# Patient Record
Sex: Male | Born: 1980 | Race: Black or African American | Hispanic: No | Marital: Single | Smoking: Current every day smoker
Health system: Southern US, Community
[De-identification: ages and names within clinical notes are randomized; demographics above are authoritative.]

---

## 2003-05-19 ENCOUNTER — Emergency Department (HOSPITAL_COMMUNITY): Admission: EM | Admit: 2003-05-19 | Discharge: 2003-05-19 | Payer: Self-pay | Admitting: Emergency Medicine

## 2004-02-11 ENCOUNTER — Emergency Department (HOSPITAL_COMMUNITY): Admission: EM | Admit: 2004-02-11 | Discharge: 2004-02-11 | Payer: Self-pay | Admitting: Emergency Medicine

## 2005-11-10 ENCOUNTER — Emergency Department (HOSPITAL_COMMUNITY): Admission: AC | Admit: 2005-11-10 | Discharge: 2005-11-10 | Payer: Self-pay

## 2011-08-09 ENCOUNTER — Encounter (HOSPITAL_COMMUNITY): Payer: Self-pay

## 2011-08-09 ENCOUNTER — Emergency Department (HOSPITAL_COMMUNITY)
Admission: EM | Admit: 2011-08-09 | Discharge: 2011-08-09 | Disposition: A | Discharge status: Home / Self Care | Payer: Self-pay | Attending: Emergency Medicine | Admitting: Emergency Medicine

## 2011-08-09 DIAGNOSIS — X58XXXA Exposure to other specified factors, initial encounter: Secondary | ICD-10-CM | POA: Insufficient documentation

## 2011-08-09 DIAGNOSIS — S335XXA Sprain of ligaments of lumbar spine, initial encounter: Secondary | ICD-10-CM | POA: Insufficient documentation

## 2011-08-09 DIAGNOSIS — S39012A Strain of muscle, fascia and tendon of lower back, initial encounter: Secondary | ICD-10-CM

## 2011-08-09 MED ORDER — OXYCODONE-ACETAMINOPHEN 5-325 MG PO TABS: 1.0000 | ORAL_TABLET | ORAL | Status: AC | PRN

## 2011-08-09 MED ORDER — CYCLOBENZAPRINE HCL 10 MG PO TABS: 10.0000 mg | ORAL_TABLET | Freq: Three times a day (TID) | ORAL | Status: AC | PRN

## 2011-08-09 NOTE — Discharge Instructions (Signed)
Take Ibuprofen - either 600 mg four times a day, or 800 mg three times a day. Take it with meals.  Back Pain, Adult Low back pain is very common. About 1 in 5 people have back pain.The cause of low back pain is rarely dangerous. The pain often gets better over time.About half of people with a sudden onset of back pain feel better in just 2 weeks. About 8 in 10 people feel better by 6 weeks.  CAUSES Some common causes of back pain include:  Strain of the muscles or ligaments supporting the spine.   Wear and tear (degeneration) of the spinal discs.   Arthritis.   Direct injury to the back.  DIAGNOSIS Most of the time, the direct cause of low back pain is not known.However, back pain can be treated effectively even when the exact cause of the pain is unknown.Answering your caregiver's questions about your overall health and symptoms is one of the most accurate ways to make sure the cause of your pain is not dangerous. If your caregiver needs more information, he or she may order lab work or imaging tests (X-rays or MRIs).However, even if imaging tests show changes in your back, this usually does not require surgery. HOME CARE INSTRUCTIONS For many people, back pain returns.Since low back pain is rarely dangerous, it is often a condition that people can learn to [facility] their own.   Remain active. It is stressful on the back to sit or stand in one place. Do not sit, drive, or stand in one place for more than 30 minutes at a time. Take short walks on level surfaces as soon as pain allows.Try to increase the length of time you walk each day.   Do not stay in bed.Resting more than 1 or 2 days can delay your recovery.   Do not avoid exercise or work.Your body is made to move.It is not dangerous to be active, even though your back may hurt.Your back will likely heal faster if you return to being active before your pain is gone.   Pay attention to your body when you bend and lift. Many  people have less discomfortwhen lifting if they bend their knees, keep the load close to their bodies,and avoid twisting. Often, the most comfortable positions are those that put less stress on your recovering back.   Find a comfortable position to sleep. Use a firm mattress and lie on your side with your knees slightly bent. If you lie on your back, put a pillow under your knees.   Only take over-the-counter or prescription medicines as directed by your caregiver. Over-the-counter medicines to reduce pain and inflammation are often the most helpful.Your caregiver may prescribe muscle relaxant drugs.These medicines help dull your pain so you can more quickly return to your normal activities and healthy exercise.   Put ice on the injured area.   Put ice in a plastic bag.   Place a towel between your skin and the bag.   Leave the ice on for 15 to 20 minutes, 3 to 4 times a day for the first 2 to 3 days. After that, ice and heat may be alternated to reduce pain and spasms.   Ask your caregiver about trying back exercises and gentle massage. This may be of some benefit.   Avoid feeling anxious or stressed.Stress increases muscle tension and can worsen back pain.It is important to recognize when you are anxious or stressed and learn ways to manage it.Exercise is a great  option.  SEEK MEDICAL CARE IF:  You have pain that is not relieved with rest or medicine.   You have pain that does not improve in 1 week.   You have new symptoms.   You are generally not feeling well.  SEEK IMMEDIATE MEDICAL CARE IF:   You have pain that radiates from your back into your legs.   You develop new bowel or bladder control problems.   You have unusual weakness or numbness in your arms or legs.   You develop nausea or vomiting.   You develop abdominal pain.   You feel faint.  Document Released: 04/02/2005 Document Revised: 03/22/2011 Document Reviewed: 08/21/2010 [facility] Patient Information  2012 [place], [place].  Cyclobenzaprine tablets What is this medicine? CYCLOBENZAPRINE (sye kloe BEN za preen) is a muscle relaxer. It is used to treat muscle pain, spasms, and stiffness. This medicine may be used for other purposes; ask your health care provider or pharmacist if you have questions. What should I tell my health care provider before I take this medicine? They need to know if you have any of these conditions: -heart disease, irregular heartbeat, or previous heart attack -liver disease -thyroid problem -an unusual or allergic reaction to cyclobenzaprine, tricyclic antidepressants, lactose, other medicines, foods, dyes, or preservatives -pregnant or trying to get pregnant -breast-feeding How should I use this medicine? Take this medicine by mouth with a glass of water. Follow the directions on the prescription label. If this medicine upsets your stomach, take it with food or milk. Take your medicine at regular intervals. Do not take it more often than directed. Talk to your pediatrician regarding the use of this medicine in children. Special care may be needed. Overdosage: If you think you have taken too much of this medicine contact a poison control center or emergency room at once. NOTE: This medicine is only for you. Do not share this medicine with others. What if I miss a dose? If you miss a dose, take it as soon as you can. If it is almost time for your next dose, take only that dose. Do not take double or extra doses. What may interact with this medicine? Do not take this medicine with any of the following medications: -cisapride -droperidol -flecainide -grepafloxacin -halofantrine -levomethadyl -MAOIs like Carbex, Eldepryl, Marplan, Nardil, and Parnate -nilotinib -pimozide -probucol -sertindole This medicine may also interact with the following medications: -abarelix -alcohol -contrast dyes -dolasetron -guanethidine -medicines for cancer -medicines for  depression, anxiety, or psychotic disturbances -medicines to treat an irregular heartbeat -medicines used for sleep or numbness during surgery or procedure -methadone -octreotide -ondansetron -palonosetron -phenothiazines like chlorpromazine, mesoridazine, prochlorperazine, thioridazine -some medicines for infection like alfuzosin, chloroquine, clarithromycin, levofloxacin, mefloquine, pentamidine, troleandomycin -tramadol -vardenafil This list may not describe all possible interactions. Give your health care provider a list of all the medicines, herbs, non-prescription drugs, or dietary supplements you use. Also tell them if you smoke, drink alcohol, or use illegal drugs. Some items may interact with your medicine. What should I watch for while using this medicine? Check with your doctor or health care professional if your condition does not improve within 1 to 3 weeks. You may get drowsy or dizzy when you first start taking the medicine or change doses. Do not drive, use machinery, or do anything that may be dangerous until you know how the medicine affects you. Stand or sit up slowly. Your mouth may get dry. Drinking water, chewing sugarless gum, or sucking on hard candy may help. What  side effects may I notice from receiving this medicine? Side effects that you should report to your doctor or health care professional as soon as possible: -allergic reactions like skin rash, itching or hives, swelling of the face, lips, or tongue -chest pain -fast heartbeat -hallucinations -seizures -vomiting Side effects that usually do not require medical attention (report to your doctor or health care professional if they continue or are bothersome): -headache This list may not describe all possible side effects. Call your doctor for medical advice about side effects. You may report side effects to FDA at 1-800-FDA-1088. Where should I keep my medicine? Keep out of the reach of children. Store at  room temperature between 15 and 30 degrees C (59 and 86 degrees F). Keep container tightly closed. Throw away any unused medicine after the expiration date. NOTE: This sheet is a summary. It may not cover all possible information. If you have questions about this medicine, talk to your doctor, pharmacist, or health care provider.  2012, [employer] Standard. (07/14/2007 10:26:21 PM)  Acetaminophen; Oxycodone tablets What is this medicine? ACETAMINOPHEN; OXYCODONE (a set a MEE noe fen; ox i KOE done) is a pain reliever. It is used to treat mild to moderate pain. This medicine may be used for other purposes; ask your health care provider or pharmacist if you have questions. What should I tell my health care provider before I take this medicine? They need to know if you have any of these conditions: -brain tumor -Crohn's disease, inflammatory bowel disease, or ulcerative colitis -drink more than 3 alcohol containing drinks per day -drug abuse or addiction -head injury -heart or circulation problems -kidney disease or problems going to the bathroom -liver disease -lung disease, asthma, or breathing problems -an unusual or allergic reaction to acetaminophen, oxycodone, other opioid analgesics, other medicines, foods, dyes, or preservatives -pregnant or trying to get pregnant -breast-feeding How should I use this medicine? Take this medicine by mouth with a full glass of water. Follow the directions on the prescription label. Take your medicine at regular intervals. Do not take your medicine more often than directed. Talk to your pediatrician regarding the use of this medicine in children. Special care may be needed. Patients over 23 years old may have a stronger reaction and need a smaller dose. Overdosage: If you think you have taken too much of this medicine contact a poison control center or emergency room at once. NOTE: This medicine is only for you. Do not share this medicine with  others. What if I miss a dose? If you miss a dose, take it as soon as you can. If it is almost time for your next dose, take only that dose. Do not take double or extra doses. What may interact with this medicine? -alcohol or medicines that contain alcohol -antihistamines -barbiturates like amobarbital, butalbital, butabarbital, methohexital, pentobarbital, phenobarbital, thiopental, and secobarbital -benztropine -drugs for bladder problems like solifenacin, trospium, oxybutynin, tolterodine, hyoscyamine, and methscopolamine -drugs for breathing problems like ipratropium and tiotropium -drugs for certain stomach or intestine problems like propantheline, homatropine methylbromide, glycopyrrolate, atropine, belladonna, and dicyclomine -general anesthetics like etomidate, ketamine, nitrous oxide, propofol, desflurane, enflurane, halothane, isoflurane, and sevoflurane -medicines for depression, anxiety, or psychotic disturbances -medicines for pain like codeine, morphine, pentazocine, buprenorphine, butorphanol, nalbuphine, tramadol, and propoxyphene -medicines for sleep -muscle relaxants -naltrexone -phenothiazines like perphenazine, thioridazine, chlorpromazine, mesoridazine, fluphenazine, prochlorperazine, promazine, and trifluoperazine -scopolamine -trihexyphenidyl This list may not describe all possible interactions. Give your health care provider a list of all the  medicines, herbs, non-prescription drugs, or dietary supplements you use. Also tell them if you smoke, drink alcohol, or use illegal drugs. Some items may interact with your medicine. What should I watch for while using this medicine? Tell your doctor or health care professional if your pain does not go away, if it gets worse, or if you have new or a different type of pain. You may develop tolerance to the medicine. Tolerance means that you will need a higher dose of the medication for pain relief. Tolerance is normal and is  expected if you take this medicine for a long time. Do not suddenly stop taking your medicine because you may develop a severe reaction. Your body becomes used to the medicine. This does NOT mean you are addicted. Addiction is a behavior related to getting and using a drug for a nonmedical reason. If you have pain, you have a medical reason to take pain medicine. Your doctor will tell you how much medicine to take. If your doctor wants you to stop the medicine, the dose will be slowly lowered over time to avoid any side effects. You may get drowsy or dizzy. Do not drive, use machinery, or do anything that needs mental alertness until you know how this medicine affects you. Do not stand or sit up quickly, especially if you are an older patient. This reduces the risk of dizzy or fainting spells. Alcohol may interfere with the effect of this medicine. Avoid alcoholic drinks. The medicine will cause constipation. Try to have a bowel movement at least every 2 to 3 days. If you do not have a bowel movement for 3 days, call your doctor or health care professional. Do not take Tylenol (acetaminophen) or medicines that have acetaminophen with this medicine. Too much acetaminophen can be very dangerous. Many nonprescription medicines contain acetaminophen. Always read the labels carefully to avoid taking more acetaminophen. What side effects may I notice from receiving this medicine? Side effects that you should report to your doctor or health care professional as soon as possible: -allergic reactions like skin rash, itching or hives, swelling of the face, lips, or tongue -breathing difficulties, wheezing -confusion -light headedness or fainting spells -severe stomach pain -yellowing of the skin or the whites of the eyes Side effects that usually do not require medical attention (report to your doctor or health care professional if they continue or are  bothersome): -dizziness -drowsiness -nausea -vomiting This list may not describe all possible side effects. Call your doctor for medical advice about side effects. You may report side effects to FDA at 1-800-FDA-1088. Where should I keep my medicine? Keep out of the reach of children. This medicine can be abused. Keep your medicine in a safe place to protect it from theft. Do not share this medicine with anyone. Selling or giving away this medicine is dangerous and against the law. Store at room temperature between 20 and 25 degrees C (68 and 77 degrees F). Keep container tightly closed. Protect from light. Flush any unused medicines down the toilet. Do not use the medicine after the expiration date. NOTE: This sheet is a summary. It may not cover all possible information. If you have questions about this medicine, talk to your doctor, pharmacist, or health care provider.  2012, [employer]vier/[employer] Standard. (03/01/2008 10:01:21 AM)
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
## 2011-08-09 NOTE — ED Notes (Signed)
Pt c/o back pain that started after putting on clothes. Denies injury

## 2012-11-13 ENCOUNTER — Emergency Department (HOSPITAL_COMMUNITY)
Admission: EM | Admit: 2012-11-13 | Discharge: 2012-11-13 | Disposition: A | Discharge status: Home / Self Care | Payer: Self-pay | Attending: Emergency Medicine | Admitting: Emergency Medicine

## 2012-11-13 ENCOUNTER — Encounter (HOSPITAL_COMMUNITY): Payer: Self-pay

## 2012-11-13 DIAGNOSIS — S39012A Strain of muscle, fascia and tendon of lower back, initial encounter: Secondary | ICD-10-CM

## 2012-11-13 DIAGNOSIS — F17200 Nicotine dependence, unspecified, uncomplicated: Secondary | ICD-10-CM | POA: Insufficient documentation

## 2012-11-13 DIAGNOSIS — X503XXA Overexertion from repetitive movements, initial encounter: Secondary | ICD-10-CM | POA: Insufficient documentation

## 2012-11-13 DIAGNOSIS — S335XXA Sprain of ligaments of lumbar spine, initial encounter: Secondary | ICD-10-CM | POA: Insufficient documentation

## 2012-11-13 DIAGNOSIS — Y929 Unspecified place or not applicable: Secondary | ICD-10-CM | POA: Insufficient documentation

## 2012-11-13 DIAGNOSIS — Y9389 Activity, other specified: Secondary | ICD-10-CM | POA: Insufficient documentation

## 2012-11-13 MED ORDER — CYCLOBENZAPRINE HCL 10 MG PO TABS: 10.0000 mg | ORAL_TABLET | Freq: Three times a day (TID) | ORAL | Status: DC | PRN

## 2012-11-13 MED ORDER — NAPROXEN 500 MG PO TABS: 500.0000 mg | ORAL_TABLET | Freq: Two times a day (BID) | ORAL | Status: DC

## 2012-11-13 NOTE — ED Notes (Signed)
Pt c/o lower back pain since moving a pool table this morning.

## 2012-11-13 NOTE — ED Provider Notes (Signed)
CSN: [id number]     Arrival date & time 11/13/12  1032 History     First MD Initiated Contact with Patient 11/13/12 1137     Chief Complaint  Patient presents with  . Back Pain   (Consider location/radiation/quality/duration/timing/severity/associated sxs/prior Treatment) Patient is a 32 y.o. male presenting with back pain. The history is provided by the patient.  Back Pain Location:  Lumbar spine Quality:  Aching Radiates to:  Does not radiate Pain severity:  Moderate Pain is:  Same all the time Onset quality:  Sudden Duration:  4 hours Timing:  Constant Progression:  Unchanged Chronicity:  New Context: lifting heavy objects, recent injury and twisting   Context: not falling and not recent illness   Relieved by:  Nothing Worsened by:  Bending, standing, twisting and sitting Ineffective treatments:  None tried Associated symptoms: no abdominal pain, no abdominal swelling, no bladder incontinence, no bowel incontinence, no chest pain, no dysuria, no fever, no headaches, no leg pain, no numbness, no paresthesias, no pelvic pain, no perianal numbness, no tingling and no weakness     History reviewed. No pertinent past medical history. History reviewed. No pertinent past surgical history. No family history on file. History  Substance Use Topics  . Smoking status: Current Every Day Smoker -- 1.00 packs/day  . Smokeless tobacco: Not on file  . Alcohol Use: No    Review of Systems  Constitutional: Negative for fever.  HENT: Negative for neck pain and neck stiffness.   Respiratory: Negative for shortness of breath.   Cardiovascular: Negative for chest pain.  Gastrointestinal: Negative for vomiting, abdominal pain, constipation and bowel incontinence.  Genitourinary: Negative for bladder incontinence, dysuria, hematuria, flank pain, decreased urine volume, difficulty urinating and pelvic pain.       No perineal numbness or incontinence of urine or feces  Musculoskeletal:  Positive for back pain. Negative for joint swelling.  Skin: Negative for rash.  Neurological: Negative for tingling, weakness, numbness, headaches and paresthesias.  All other systems reviewed and are negative.    Allergies  Review of patient's allergies indicates no known allergies.  Home Medications  No current outpatient prescriptions on file. BP 150/85  Pulse 80  Temp(Src) 97.6 F (36.4 C) (Oral)  Resp 18  Ht 5\' 11"  (1.803 m)  Wt 230 lb (104.327 kg)  BMI 32.09 kg/m2  SpO2 100% Physical Exam  Nursing note and vitals reviewed. Constitutional: He is oriented to person, place, and time. He appears well-developed and well-nourished. No distress.  HENT:  Head: Normocephalic and atraumatic.  Neck: Normal range of motion. Neck supple.  Cardiovascular: Normal rate, regular rhythm, normal heart sounds and intact distal pulses.   No murmur heard. Pulmonary/Chest: Effort normal and breath sounds normal. No respiratory distress.  Abdominal: Soft. He exhibits no distension. There is no tenderness. There is no rebound and no guarding.  Musculoskeletal: He exhibits tenderness. He exhibits no edema.       Lumbar back: He exhibits tenderness and pain. He exhibits normal range of motion, no swelling, no deformity, no laceration and normal pulse.       Back:  ttp of the left lumbar paraspinal muscles and SI joint.  No spinal tenderness.  DP pulses are brisk and symmetrical.  Distal sensation intact.  Hip Flexors/Extensors are intact  Neurological: He is alert and oriented to person, place, and time. No cranial nerve deficit or sensory deficit. He exhibits normal muscle tone. Coordination and gait normal.  Reflex Scores:  Patellar reflexes are 2+ on the right side and 2+ on the left side.      Achilles reflexes are 2+ on the right side and 2+ on the left side. Skin: Skin is warm and dry.    ED Course   Procedures (including critical care time)  Labs Reviewed - No data to  display   MDM    Patient has ttp of the left lumbar paraspinal muscles and SI joint.  No focal neuro deficits on exam.  Ambulates with a steady gait.   Doubt emergent neurological or infectious process.  Likely muscular strain.  VSS, pt appears stable for discharge.    Mung Rinker L. Diania Co, PA-C 11/15/12 1249

## 2012-11-13 NOTE — ED Notes (Signed)
States that he was helping some friends move a pool table and injured his lower back.  States pain is radiating down his left leg.

## 2012-11-18 NOTE — ED Provider Notes (Signed)
Medical screening examination/treatment/procedure(s) were performed by non-physician practitioner and as supervising physician I was immediately available for consultation/collaboration.   Gavrielle Streck B. Bernette Mayers, MD 11/18/12 1610

## 2013-08-19 ENCOUNTER — Emergency Department (HOSPITAL_COMMUNITY)
Admission: EM | Admit: 2013-08-19 | Discharge: 2013-08-19 | Disposition: A | Discharge status: Home / Self Care | Payer: Self-pay | Attending: Emergency Medicine | Admitting: Emergency Medicine

## 2013-08-19 ENCOUNTER — Encounter (HOSPITAL_COMMUNITY): Payer: Self-pay | Admitting: Emergency Medicine

## 2013-08-19 ENCOUNTER — Emergency Department (HOSPITAL_COMMUNITY): Payer: Self-pay

## 2013-08-19 DIAGNOSIS — F17200 Nicotine dependence, unspecified, uncomplicated: Secondary | ICD-10-CM | POA: Insufficient documentation

## 2013-08-19 DIAGNOSIS — Y92410 Unspecified street and highway as the place of occurrence of the external cause: Secondary | ICD-10-CM | POA: Insufficient documentation

## 2013-08-19 DIAGNOSIS — IMO0002 Reserved for concepts with insufficient information to code with codable children: Secondary | ICD-10-CM | POA: Insufficient documentation

## 2013-08-19 DIAGNOSIS — S39012A Strain of muscle, fascia and tendon of lower back, initial encounter: Secondary | ICD-10-CM

## 2013-08-19 DIAGNOSIS — Y9389 Activity, other specified: Secondary | ICD-10-CM | POA: Insufficient documentation

## 2013-08-19 MED ORDER — CYCLOBENZAPRINE HCL 5 MG PO TABS: 5.0000 mg | ORAL_TABLET | Freq: Two times a day (BID) | ORAL | Status: AC | PRN

## 2013-08-19 MED ORDER — HYDROCODONE-ACETAMINOPHEN 5-325 MG PO TABS: 1.0000 | ORAL_TABLET | Freq: Four times a day (QID) | ORAL | Status: AC | PRN
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
## 2013-08-19 NOTE — Discharge Instructions (Signed)
Back Pain, Adult  Back pain is very common. The pain often gets better over time. The cause of back pain is usually not dangerous. Most people can learn to manage their back pain on their own.   HOME CARE   · Stay active. Start with short walks on flat ground if you can. Try to walk farther each day.  · Do not sit, drive, or stand in one place for more than 30 minutes. Do not stay in bed.  · Do not avoid exercise or work. Activity can help your back heal faster.  · Be careful when you bend or lift an object. Bend at your knees, keep the object close to you, and do not twist.  · Sleep on a firm mattress. Lie on your side, and bend your knees. If you lie on your back, put a pillow under your knees.  · Only take medicines as told by your doctor.  · Put ice on the injured area.  · Put ice in a plastic bag.  · Place a towel between your skin and the bag.  · Leave the ice on for 15-20 minutes, 03-04 times a day for the first 2 to 3 days. After that, you can switch between ice and heat packs.  · Ask your doctor about back exercises or massage.  · Avoid feeling anxious or stressed. Find good ways to deal with stress, such as exercise.  GET HELP RIGHT AWAY IF:   · Your pain does not go away with rest or medicine.  · Your pain does not go away in 1 week.  · You have new problems.  · You do not feel well.  · The pain spreads into your legs.  · You cannot control when you poop (bowel movement) or pee (urinate).  · Your arms or legs feel weak or lose feeling (numbness).  · You feel sick to your stomach (nauseous) or throw up (vomit).  · You have belly (abdominal) pain.  · You feel like you may pass out (faint).  MAKE SURE YOU:   · Understand these instructions.  · Will watch your condition.  · Will get help right away if you are not doing well or get worse.  Document Released: 09/19/2007 Document Revised: 06/25/2011 Document Reviewed: 08/21/2010  [employer]® Patient Information ©2014 [employer].
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
## 2013-08-20 NOTE — ED Provider Notes (Signed)
Medical screening examination/treatment/procedure(s) were performed by non-physician practitioner and as supervising physician I was immediately available for consultation/collaboration.   EKG Interpretation None       Donnetta HutchingBrian Lalita Ebel, MD 08/20/13 1531
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
## 2021-01-13 ENCOUNTER — Emergency Department: Admission: EM | Admit: 2021-01-13 | Payer: Self-pay | Source: Home / Self Care

## 2022-12-02 ENCOUNTER — Emergency Department (HOSPITAL_COMMUNITY)
Admission: EM | Admit: 2022-12-02 | Discharge: 2022-12-02 | Discharge status: Against Medical Advice | Payer: Self-pay | Attending: Emergency Medicine | Admitting: Emergency Medicine

## 2022-12-02 ENCOUNTER — Encounter (HOSPITAL_COMMUNITY): Payer: Self-pay

## 2022-12-02 DIAGNOSIS — R197 Diarrhea, unspecified: Secondary | ICD-10-CM | POA: Insufficient documentation

## 2022-12-02 DIAGNOSIS — Z5321 Procedure and treatment not carried out due to patient leaving prior to being seen by health care provider: Secondary | ICD-10-CM | POA: Insufficient documentation

## 2022-12-02 DIAGNOSIS — R1110 Vomiting, unspecified: Secondary | ICD-10-CM | POA: Insufficient documentation

## 2022-12-02 LAB — COMPREHENSIVE METABOLIC PANEL
ALT: 30 U/L (ref 0–44)
AST: 24 U/L (ref 15–41)
Albumin: 4.5 g/dL (ref 3.5–5.0)
Alkaline Phosphatase: 71 U/L (ref 38–126)
Anion gap: 13 (ref 5–15)
BUN: 11 mg/dL (ref 6–20)
CO2: 23 mmol/L (ref 22–32)
Calcium: 9.6 mg/dL (ref 8.9–10.3)
Chloride: 101 mmol/L (ref 98–111)
Creatinine, Ser: 0.71 mg/dL (ref 0.61–1.24)
GFR, Estimated: >60 mL/min (ref >60)
Glucose, Bld: 158 mg/dL — ABNORMAL HIGH (ref 70–99)
Potassium: 4.2 mmol/L (ref 3.5–5.1)
Sodium: 137 mmol/L (ref 135–145)
Total Bilirubin: 0.4 mg/dL (ref 0.3–1.2)
Total Protein: 8.1 g/dL (ref 6.5–8.1)

## 2022-12-02 LAB — CBC
HCT: 43.6 % (ref 39.0–52.0)
Hemoglobin: 15.1 g/dL (ref 13.0–17.0)
MCH: 30.2 pg (ref 26.0–34.0)
MCHC: 34.6 g/dL (ref 30.0–36.0)
MCV: 87.2 fL (ref 80.0–100.0)
Platelets: 287 10*3/uL (ref 150–400)
RBC: 5 MIL/uL (ref 4.22–5.81)
RDW: 15.6 % — ABNORMAL HIGH (ref 11.5–15.5)
WBC: 11.2 10*3/uL — ABNORMAL HIGH (ref 4.0–10.5)
nRBC: 0 % (ref 0.0–0.2)

## 2022-12-02 LAB — LIPASE, BLOOD: Lipase: 22 U/L (ref 11–51)

## 2022-12-02 NOTE — ED Triage Notes (Signed)
Pt stated that he began having V/D this morning around 0300. Pt stated that there is no blood present.

## 2022-12-03 ENCOUNTER — Other Ambulatory Visit: Payer: Self-pay

## 2022-12-03 ENCOUNTER — Encounter (HOSPITAL_COMMUNITY): Payer: Self-pay | Admitting: Emergency Medicine

## 2022-12-03 ENCOUNTER — Emergency Department (HOSPITAL_COMMUNITY)
Admission: EM | Admit: 2022-12-03 | Discharge: 2022-12-03 | Disposition: A | Discharge status: Home / Self Care | Payer: BC Managed Care – PPO | Attending: Emergency Medicine | Admitting: Emergency Medicine

## 2022-12-03 DIAGNOSIS — Z20822 Contact with and (suspected) exposure to covid-19: Secondary | ICD-10-CM | POA: Insufficient documentation

## 2022-12-03 DIAGNOSIS — K529 Noninfective gastroenteritis and colitis, unspecified: Secondary | ICD-10-CM

## 2022-12-03 LAB — COMPREHENSIVE METABOLIC PANEL
ALT: 28 U/L (ref 0–44)
AST: 23 U/L (ref 15–41)
Albumin: 4.4 g/dL (ref 3.5–5.0)
Alkaline Phosphatase: 68 U/L (ref 38–126)
Anion gap: 13 (ref 5–15)
BUN: 10 mg/dL (ref 6–20)
CO2: 26 mmol/L (ref 22–32)
Calcium: 9.3 mg/dL (ref 8.9–10.3)
Chloride: 98 mmol/L (ref 98–111)
Creatinine, Ser: 0.82 mg/dL (ref 0.61–1.24)
GFR, Estimated: >60 mL/min (ref >60)
Glucose, Bld: 123 mg/dL — ABNORMAL HIGH (ref 70–99)
Potassium: 3.9 mmol/L (ref 3.5–5.1)
Sodium: 137 mmol/L (ref 135–145)
Total Bilirubin: 0.7 mg/dL (ref 0.3–1.2)
Total Protein: 7.9 g/dL (ref 6.5–8.1)

## 2022-12-03 LAB — SARS CORONAVIRUS 2 BY RT PCR: SARS Coronavirus 2 by RT PCR: NEGATIVE

## 2022-12-03 LAB — CBC
HCT: 45.2 % (ref 39.0–52.0)
Hemoglobin: 15.7 g/dL (ref 13.0–17.0)
MCH: 30.1 pg (ref 26.0–34.0)
MCHC: 34.7 g/dL (ref 30.0–36.0)
MCV: 86.8 fL (ref 80.0–100.0)
Platelets: 293 10*3/uL (ref 150–400)
RBC: 5.21 MIL/uL (ref 4.22–5.81)
RDW: 15.4 % (ref 11.5–15.5)
WBC: 17.6 10*3/uL — ABNORMAL HIGH (ref 4.0–10.5)
nRBC: 0 % (ref 0.0–0.2)

## 2022-12-03 LAB — LIPASE, BLOOD: Lipase: 22 U/L (ref 11–51)

## 2022-12-03 MED ORDER — ONDANSETRON 8 MG PO TBDP
8.0000 mg | ORAL_TABLET | Freq: Once | ORAL | Status: AC
  Administered 2022-12-03: 8 mg via ORAL
  Filled 2022-12-03: qty 1

## 2022-12-03 MED ORDER — ONDANSETRON 8 MG PO TBDP: ORAL_TABLET | ORAL | 0 refills | Status: AC

## 2022-12-03 NOTE — ED Triage Notes (Signed)
Pt states he woke up this morning with Emesis and diarrhea. States he has vomited 3 times since 0300. Pt thinks he may have Covid because several people at his work have it.

## 2022-12-03 NOTE — ED Provider Notes (Signed)
[place] EMERGENCY DEPARTMENT AT [facility] Provider Note   CSN: [id number] Arrival date & time: 12/03/22  0216     History  Chief Complaint  Patient presents with   Emesis   Diarrhea    Austin Rogers is a 42 y.o. male.  Patient is a 42 year old male with no significant past medical history.  Patient presenting with complaints of nausea, vomiting, and diarrhea that woke him up several hours ago.  All has been nonbloody.  He denies any fevers or chills.  He denies abdominal pain.  He denies having eaten any undercooked or suspicious foods.  He does report people at work having COVID and is concerned he may have this as well.  The history is provided by the patient.       Home Medications Prior to Admission medications   Medication Sig Start Date End Date Taking? Authorizing Provider  cyclobenzaprine (FLEXERIL) 5 MG tablet Take 1 tablet (5 mg total) by mouth 2 (two) times daily as needed for muscle spasms. 08/19/13   Teressa Lower, NP  HYDROcodone-acetaminophen (NORCO/VICODIN) 5-325 MG per tablet Take 1-2 tablets by mouth every 6 (six) hours as needed. 08/19/13   Teressa Lower, NP  ibuprofen (ADVIL,MOTRIN) 200 MG tablet Take 400 mg by mouth every 6 (six) hours as needed for moderate pain.    [provider]      Allergies    Patient has no known allergies.    Review of Systems   Review of Systems  All other systems reviewed and are negative.   Physical Exam Updated Vital Signs BP (!) 162/99 (BP Location: Left Arm)   Pulse 76   Temp 98.2 F (36.8 C) (Oral)   Resp 16   Ht 5\' 11"  (1.803 m)   Wt 97.5 kg   SpO2 98%   BMI 29.99 kg/m  Physical Exam Vitals and nursing note reviewed.  Constitutional:      General: He is not in acute distress.    Appearance: He is well-developed. He is not diaphoretic.  HENT:     Head: Normocephalic and atraumatic.  Cardiovascular:     Rate and Rhythm: Normal rate and regular rhythm.     Heart sounds:  No murmur heard.    No friction rub.  Pulmonary:     Effort: Pulmonary effort is normal. No respiratory distress.     Breath sounds: Normal breath sounds. No wheezing or rales.  Abdominal:     General: Bowel sounds are normal. There is no distension.     Palpations: Abdomen is soft.     Tenderness: There is no abdominal tenderness.  Musculoskeletal:        General: Normal range of motion.     Cervical back: Normal range of motion and neck supple.  Skin:    General: Skin is warm and dry.  Neurological:     Mental Status: He is alert and oriented to person, place, and time.     Coordination: Coordination normal.     ED Results / Procedures / Treatments   Labs (all labs ordered are listed, but only abnormal results are displayed) Labs Reviewed  COMPREHENSIVE METABOLIC PANEL - Abnormal; Notable for the following components:      Result Value   Glucose, Bld 123 (*)    All other components within normal limits  CBC - Abnormal; Notable for the following components:   WBC 17.6 (*)    All other components within normal limits  SARS CORONAVIRUS 2  BY RT PCR  LIPASE, BLOOD  URINALYSIS, ROUTINE W REFLEX MICROSCOPIC    EKG None  Radiology No results found.  Procedures Procedures    Medications Ordered in ED Medications  ondansetron (ZOFRAN-ODT) disintegrating tablet 8 mg (8 mg Oral Given 12/03/22 0352)    ED Course/ Medical Decision Making/ A&P  Patient presenting with nausea, vomiting, and diarrhea as described in the HPI.  He arrives here with stable vital signs and physical examination which is basically unremarkable.  Abdomen is benign and he is well hydrated in appearance.  Laboratory studies obtained including CBC, CMP, and lipase, all of which are unremarkable.  COVID test is negative.  Patient given ODT Zofran and p.o. challenge and tolerated this well.  I feel as though patient can safely be discharged with Zofran and as needed return.  Symptoms most likely viral or  possibly foodborne.  Final Clinical Impression(s) / ED Diagnoses Final diagnoses:  None    Rx / DC Orders ED Discharge Orders     None         Geoffery Lyons, MD 12/03/22 [phone]

## 2022-12-03 NOTE — Discharge Instructions (Signed)
Begin taking Zofran as prescribed as needed for nausea.  Clear liquid diet for the next 12 hours, then slowly advance to normal as tolerated.  Return to the emergency department if you develop severe abdominal pain, bloody stools or vomit, or for other new and concerning symptoms.

## 2023-09-01 ENCOUNTER — Other Ambulatory Visit: Payer: Self-pay

## 2023-09-01 ENCOUNTER — Ambulatory Visit
Admission: EM | Admit: 2023-09-01 | Discharge: 2023-09-01 | Disposition: A | Discharge status: Home / Self Care | Payer: Self-pay | Attending: Family Medicine | Admitting: Family Medicine

## 2023-09-01 ENCOUNTER — Encounter: Payer: Self-pay | Admitting: Emergency Medicine

## 2023-09-01 DIAGNOSIS — R112 Nausea with vomiting, unspecified: Secondary | ICD-10-CM

## 2023-09-01 MED ORDER — ONDANSETRON 4 MG PO TBDP
4.0000 mg | ORAL_TABLET | Freq: Once | ORAL | Status: AC
  Administered 2023-09-01: 4 mg via ORAL

## 2023-09-01 MED ORDER — ONDANSETRON 4 MG PO TBDP: 4.0000 mg | ORAL_TABLET | Freq: Three times a day (TID) | ORAL | 0 refills | Status: AC | PRN

## 2023-09-01 NOTE — ED Triage Notes (Addendum)
 Pt reports body aches, emesis, abd pain, insomnia since last night. Reports had one zofran  at home and states "threw that up" pta to UC.

## 2023-09-01 NOTE — ED Provider Notes (Signed)
 [facility]    CSN: [id number] Arrival date & time: 09/01/23  0855      History   Chief Complaint Chief Complaint  Patient presents with   Generalized Body Aches    HPI Austin Rogers is a 43 y.o. male.   Patient presenting today with 1 day history of bodyaches, nausea, vomiting, mild abdominal discomfort.  Denies diarrhea, severe abdominal pain, fevers, upper respiratory symptoms, new foods or medications, recent sick contacts.  So far tried some over-the-counter nausea medication with minimal relief.    History reviewed. No pertinent past medical history.  There are no active problems to display for this patient.   History reviewed. No pertinent surgical history.     Home Medications    Prior to Admission medications   Medication Sig Start Date End Date Taking? Authorizing Provider  ondansetron  (ZOFRAN -ODT) 4 MG disintegrating tablet Take 1 tablet (4 mg total) by mouth every 8 (eight) hours as needed for nausea or vomiting. 09/01/23  Yes Corbin Dess, PA-C  cyclobenzaprine  (FLEXERIL ) 5 MG tablet Take 1 tablet (5 mg total) by mouth 2 (two) times daily as needed for muscle spasms. 08/19/13   Pickering, Vrinda, NP  HYDROcodone -acetaminophen  (NORCO/VICODIN) 5-325 MG per tablet Take 1-2 tablets by mouth every 6 (six) hours as needed. 08/19/13   Pickering, Vrinda, NP  ibuprofen (ADVIL,MOTRIN) 200 MG tablet Take 400 mg by mouth every 6 (six) hours as needed for moderate pain.    [provider]  ondansetron  (ZOFRAN -ODT) 8 MG disintegrating tablet 8mg  ODT q4 hours prn nausea 12/03/22   Orvilla Blander, MD    Family History History reviewed. No pertinent family history.  Social History Social History   Tobacco Use   Smoking status: Every Day    Current packs/day: 1.00    Types: Cigarettes  Substance Use Topics   Alcohol use: No   Drug use: No     Allergies   Patient has no known allergies.   Review of Systems Review of Systems Per  HPI  Physical Exam Triage Vital Signs ED Triage Vitals [09/01/23 0925]  Encounter Vitals Group     BP (!) 175/108     Systolic BP Percentile      Diastolic BP Percentile      Pulse      Resp 20     Temp 97.7 F (36.5 C)     Temp Source Oral     SpO2 97 %     Weight      Height      Head Circumference      Peak Flow      Pain Score 7     Pain Loc      Pain Education      Exclude from Growth Chart    No data found.  Updated Vital Signs BP (!) 175/108 (BP Location: Right Arm) Comment: x2 attempts. denis hx but reports "when im sick or stressed it gets high."  Temp 97.7 F (36.5 C) (Oral)   Resp 20   SpO2 97%   Visual Acuity Right Eye Distance:   Left Eye Distance:   Bilateral Distance:    Right Eye Near:   Left Eye Near:    Bilateral Near:     Physical Exam Vitals and nursing note reviewed.  Constitutional:      Appearance: Normal appearance.  HENT:     Head: Atraumatic.     Mouth/Throat:     Mouth: Mucous membranes are moist.  Eyes:     Extraocular Movements: Extraocular movements intact.     Conjunctiva/sclera: Conjunctivae normal.  Cardiovascular:     Rate and Rhythm: Normal rate and regular rhythm.  Pulmonary:     Effort: Pulmonary effort is normal.     Breath sounds: Normal breath sounds.  Abdominal:     General: Bowel sounds are normal. There is no distension.     Palpations: Abdomen is soft.     Tenderness: There is no abdominal tenderness. There is no guarding.  Musculoskeletal:        General: Normal range of motion.     Cervical back: Normal range of motion and neck supple.  Skin:    General: Skin is warm and dry.  Neurological:     General: No focal deficit present.     Mental Status: He is oriented to person, place, and time.  Psychiatric:        Mood and Affect: Mood normal.        Thought Content: Thought content normal.        Judgment: Judgment normal.      UC Treatments / Results  Labs (all labs ordered are listed, but only  abnormal results are displayed) Labs Reviewed - No data to display  EKG   Radiology No results found.  Procedures Procedures (including critical care time)  Medications Ordered in UC Medications  ondansetron  (ZOFRAN -ODT) disintegrating tablet 4 mg (4 mg Oral Given 09/01/23 0955)    Initial Impression / Assessment and Plan / UC Course  I have reviewed the triage vital signs and the nursing notes.  Pertinent labs & imaging results that were available during my care of the patient were reviewed by me and considered in my medical decision making (see chart for details).     Vitals and exam reassuring today, no red flag findings.  Suspect viral GI illness.  Treat with Zofran , brat diet, fluids, rest.  Return for worsening symptoms.  Work note given.  Final Clinical Impressions(s) / UC Diagnoses   Final diagnoses:  Nausea and vomiting, unspecified vomiting type   Discharge Instructions   None    ED Prescriptions     Medication Sig Dispense Auth. Provider   ondansetron  (ZOFRAN -ODT) 4 MG disintegrating tablet Take 1 tablet (4 mg total) by mouth every 8 (eight) hours as needed for nausea or vomiting. 20 tablet Corbin Dess, [place]      PDMP not reviewed this encounter.   Corbin Dess, [place] 09/01/23 1159
